# Patient Record
Sex: Female | Born: 2000 | Race: White | Hispanic: No | Marital: Single | Smoking: Never smoker
Health system: Southern US, Community
[De-identification: ages and names within clinical notes are randomized; demographics above are authoritative.]

---
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
## 2019-01-12 IMAGING — DX DG ABDOMEN 1V
2 series · 2 of 2 positions shown · non-contrast
Comparison: None.

CLINICAL DATA: Periumbilical pain for 5 days

EXAM:
ABDOMEN - 1 VIEW

[abdomen kub (1 of 2)]
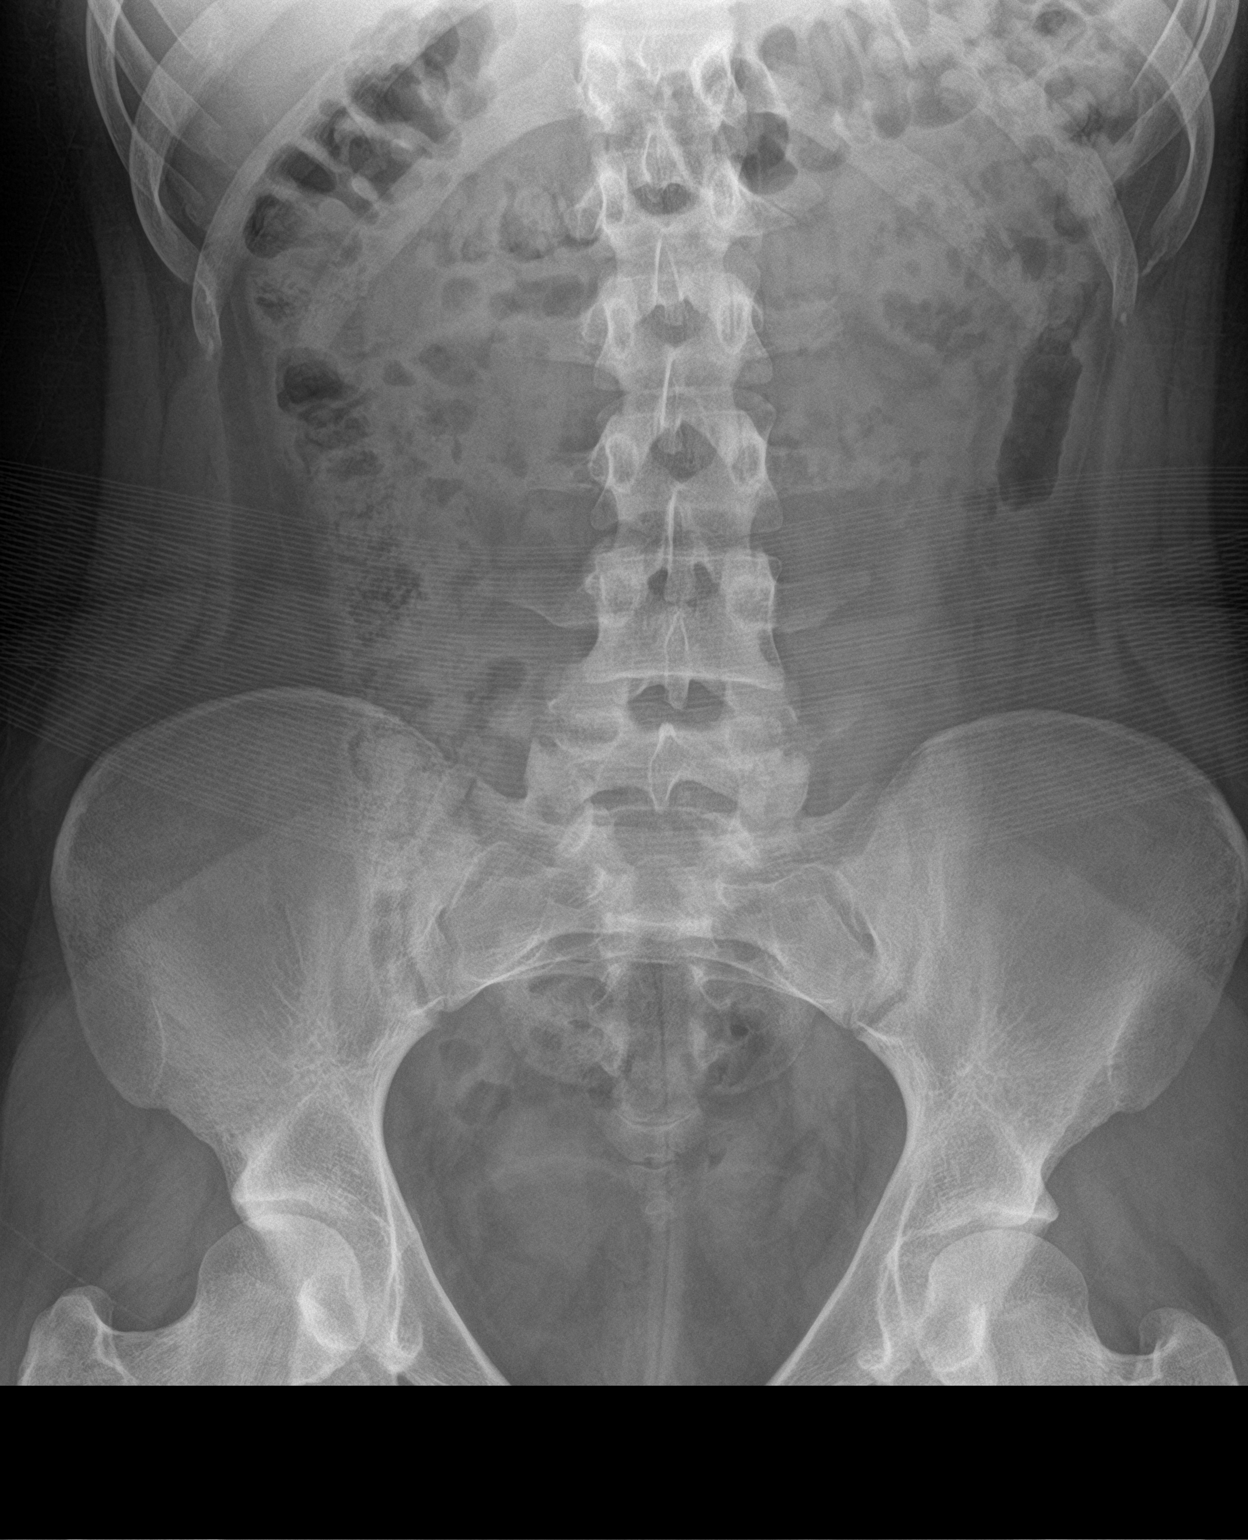

[abdomen kub (2 of 2)]
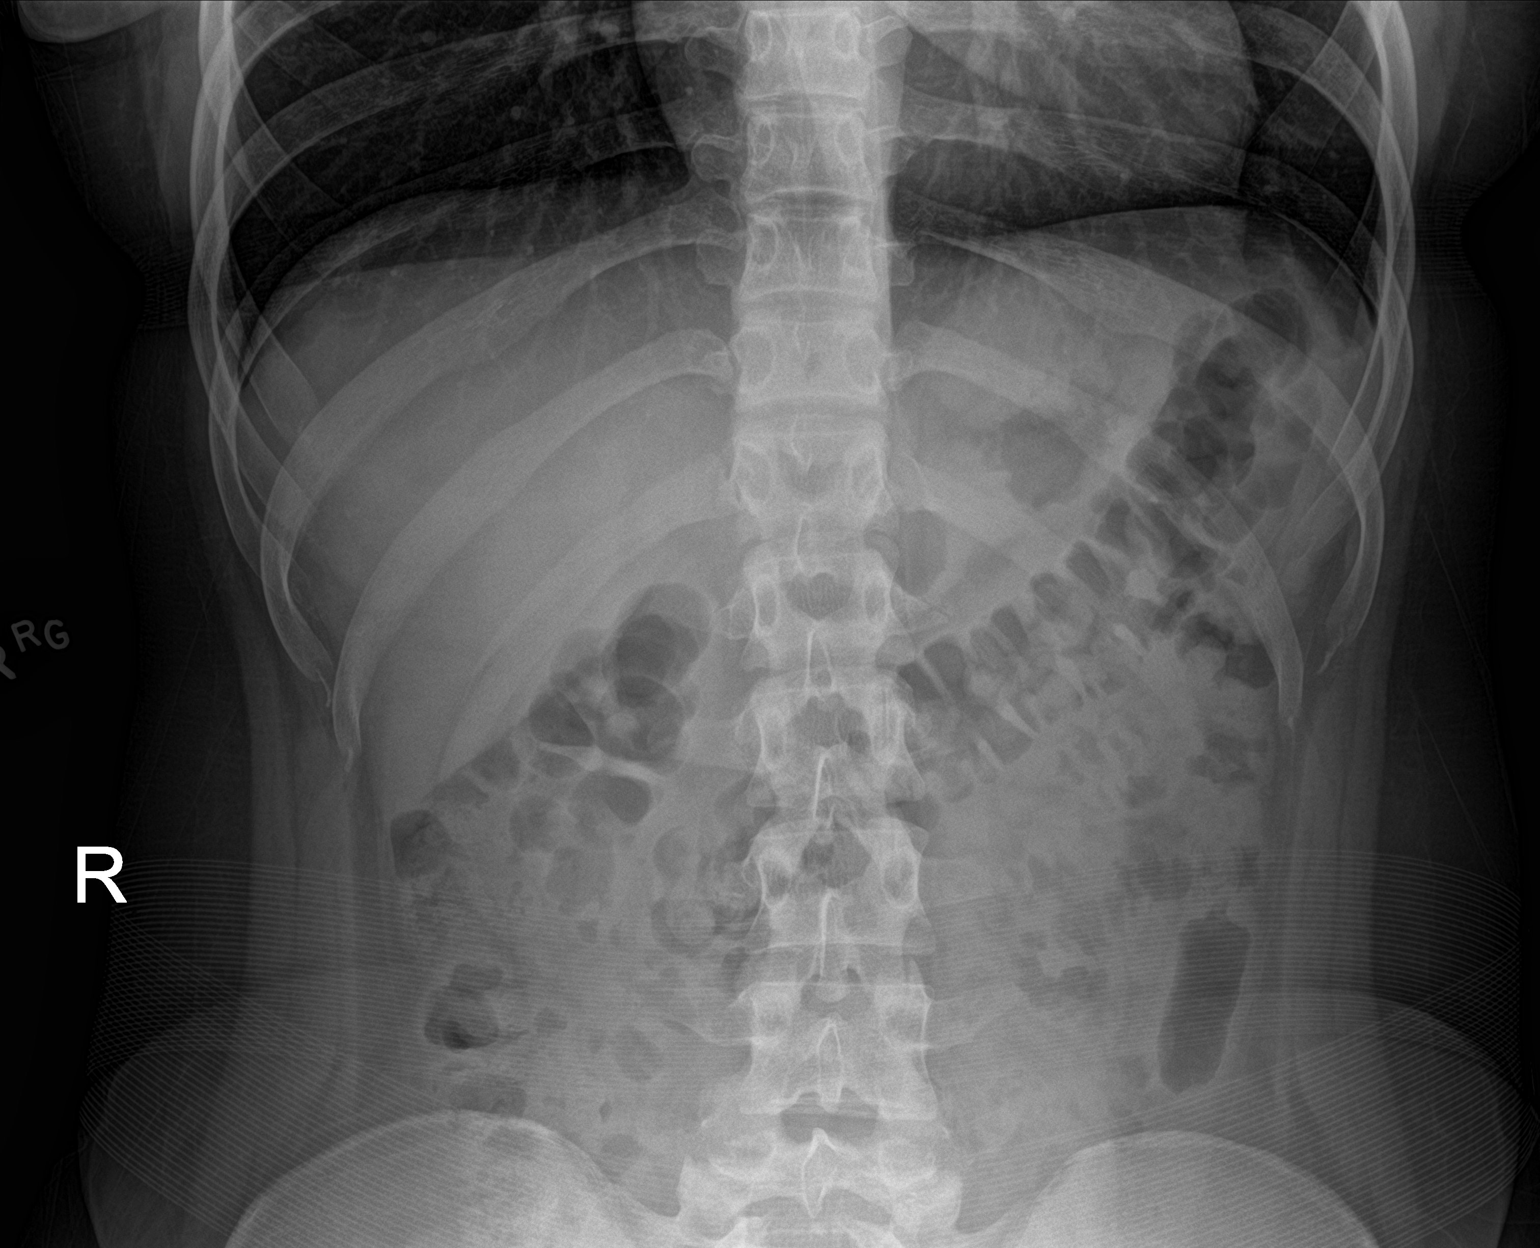

[2 of 2 positions shown; findings below may reference images not displayed]

FINDINGS: There is moderate stool throughout the colon. There is no bowel
dilatation or air-fluid level to suggest bowel obstruction. No free
air. No abnormal calcifications. Lung bases are clear.
IMPRESSION: No evident bowel obstruction or free air.  Moderate stool in colon.

## 2024-01-09 ENCOUNTER — Ambulatory Visit
Admission: RE | Admit: 2024-01-09 | Discharge: 2024-01-09 | Disposition: A | Discharge status: Home / Self Care | Payer: 59 | Source: Ambulatory Visit

## 2024-01-09 ENCOUNTER — Other Ambulatory Visit: Payer: Self-pay

## 2024-01-09 VITALS — BP 140/87 | HR 94 | Temp 98.6°F | Resp 17

## 2024-01-09 DIAGNOSIS — J0100 Acute maxillary sinusitis, unspecified: Secondary | ICD-10-CM | POA: Diagnosis not present

## 2024-01-09 DIAGNOSIS — R059 Cough, unspecified: Secondary | ICD-10-CM | POA: Diagnosis not present

## 2024-01-09 MED ORDER — AMOXICILLIN 875 MG PO TABS: 875.0000 mg | ORAL_TABLET | Freq: Two times a day (BID) | ORAL | 0 refills | Status: AC

## 2024-01-09 MED ORDER — PREDNISONE 20 MG PO TABS
ORAL_TABLET | ORAL | 0 refills | Status: AC
Start: 2024-01-09 — End: ?

## 2024-01-09 MED ORDER — HYDROCODONE BIT-HOMATROP MBR 5-1.5 MG/5ML PO SOLN: 5.0000 mL | Freq: Four times a day (QID) | ORAL | 0 refills | Status: AC | PRN

## 2024-01-09 NOTE — ED Provider Notes (Signed)
[facility]    CSN: [id number] Arrival date & time: 01/09/24  1006      History   Chief Complaint Chief Complaint  Patient presents with   Nasal Congestion   Ear Fullness   Cough    HPI Lisa English is a 23 y.o. female.   HPI 23 year old female presents with nasal congestion cough and ear fullness for 2 days patient reports that she is currently on amoxicillin and is concerned with pneumonia.  History reviewed. No pertinent past medical history.  There are no active problems to display for this patient.   History reviewed. No pertinent surgical history.  OB History   No obstetric history on file.      Home Medications    Prior to Admission medications   Medication Sig Start Date End Date Taking? Authorizing Provider  amoxicillin (AMOXIL) 875 MG tablet Take 1 tablet (875 mg total) by mouth 2 (two) times daily for 7 days. 01/09/24 01/16/24 Yes Trevor Iha, FNP  amphetamine-dextroamphetamine (ADDERALL XR) 20 MG 24 hr capsule Take by mouth. 03/18/23  Yes [provider]  HYDROcodone bit-homatropine (HYCODAN) 5-1.5 MG/5ML syrup Take 5 mLs by mouth every 6 (six) hours as needed for cough. 01/09/24  Yes Trevor Iha, FNP  predniSONE (DELTASONE) 20 MG tablet Take 3 tabs PO daily x 5 days. 01/09/24  Yes Trevor Iha, FNP  norethindrone-ethinyl estradiol-iron (ESTROSTEP FE,TILIA FE,TRI-LEGEST FE) 1-20/1-30/1-35 MG-MCG tablet Take 1 tablet by mouth daily.    [provider]    Family History History reviewed. No pertinent family history.  Social History Social History   Tobacco Use   Smoking status: Never   Smokeless tobacco: Never  Substance Use Topics   Alcohol use: No   Drug use: No     Allergies   Amoxicillin-pot clavulanate   Review of Systems Review of Systems  HENT:  Positive for congestion and ear pain.   Respiratory:  Positive for cough.   All other systems reviewed and are negative.    Physical Exam Triage Vital  Signs ED Triage Vitals  Encounter Vitals Group     BP      Systolic BP Percentile      Diastolic BP Percentile      Pulse      Resp      Temp      Temp src      SpO2      Weight      Height      Head Circumference      Peak Flow      Pain Score      Pain Loc      Pain Education      Exclude from Growth Chart    No data found.  Updated Vital Signs BP (!) 140/87 (BP Location: Right Arm)   Pulse 94   Temp 98.6 F (37 C) (Oral)   Resp 17   SpO2 100%    Physical Exam Vitals and nursing note reviewed.  Constitutional:      General: She is not in acute distress.    Appearance: Normal appearance. She is normal weight. She is ill-appearing.  HENT:     Head: Normocephalic and atraumatic.     Right Ear: Tympanic membrane and external ear normal.     Left Ear: Tympanic membrane and external ear normal.     Mouth/Throat:     Mouth: Mucous membranes are moist.     Pharynx: Oropharynx is clear.  Eyes:  Extraocular Movements: Extraocular movements intact.     Conjunctiva/sclera: Conjunctivae normal.     Pupils: Pupils are equal, round, and reactive to light.  Cardiovascular:     Rate and Rhythm: Normal rate and regular rhythm.     Pulses: Normal pulses.     Heart sounds: Normal heart sounds.  Pulmonary:     Effort: Pulmonary effort is normal.     Breath sounds: Normal breath sounds. No wheezing, rhonchi or rales.     Comments: Frequent nonproductive cough on exam Musculoskeletal:        General: Normal range of motion.     Cervical back: Normal range of motion and neck supple.  Skin:    General: Skin is warm and dry.  Neurological:     General: No focal deficit present.     Mental Status: She is alert and oriented to person, place, and time. Mental status is at baseline.  Psychiatric:        Mood and Affect: Mood normal.        Behavior: Behavior normal.      UC Treatments / Results  Labs (all labs ordered are listed, but only abnormal results are  displayed) Labs Reviewed - No data to display  EKG   Radiology No results found.  Procedures Procedures (including critical care time)  Medications Ordered in UC Medications - No data to display  Initial Impression / Assessment and Plan / UC Course  I have reviewed the triage vital signs and the nursing notes.  Pertinent labs & imaging results that were available during my care of the patient were reviewed by me and considered in my medical decision making (see chart for details).     MDM: 1.  Acute maxillary sinusitis, recurrence not specified-Rx'd amoxicillin 875 mg tablet: Take 1 tablet twice daily x 7 days patient reports taking this medication numerous times without adverse reaction.  Patient reports extreme allergy to clavulanic acid.;  2.  Cough, unspecified type-Rx prednisone 20 mg tablet: Take 3 tabs p.o. daily x 5 days, Rx'd Hycodan 5-1.5 mg / 5 mL syrup: Take 5 mL every 6 hours, as needed for cough. Advised patient to take medication as directed with food to completion.  Advised patient to take prednisone with first dose of Amoxicillin for the next 5 of 7 days.  Advised may use Hycodan cough syrup at night prior to sleep due to sedative effects.  Encouraged to increase daily water intake to 64 ounces per day while taking these medications.  Advised if symptoms worsen and/or unresolved please follow-up PCP or here for further evaluation.  Patient discharged home, hemodynamically stable. Final Clinical Impressions(s) / UC Diagnoses   Final diagnoses:  Cough, unspecified type  Acute maxillary sinusitis, recurrence not specified     Discharge Instructions      Advised patient to take medication as directed with food to completion.  Advised patient to take prednisone with first dose of Amoxicillin for the next 5 of 7 days.  Advised may use Hycodan cough syrup at night prior to sleep due to sedative effects.  Encouraged to increase daily water intake to 64 ounces per day while  taking these medications.  Advised if symptoms worsen and/or unresolved please follow-up PCP or here for further evaluation.     ED Prescriptions     Medication Sig Dispense Auth. Provider   amoxicillin (AMOXIL) 875 MG tablet Take 1 tablet (875 mg total) by mouth 2 (two) times daily for 7 days. 14 tablet  Trevor Iha, FNP   predniSONE (DELTASONE) 20 MG tablet Take 3 tabs PO daily x 5 days. 15 tablet Trevor Iha, FNP   HYDROcodone bit-homatropine (HYCODAN) 5-1.5 MG/5ML syrup Take 5 mLs by mouth every 6 (six) hours as needed for cough. 120 mL Trevor Iha, FNP      I have reviewed the PDMP during this encounter.   Trevor Iha, FNP 01/09/24 1115

## 2024-01-09 NOTE — ED Triage Notes (Signed)
Pt c/o nasal congestion, cough and ear congestion since Thurs. Currently on amoxicillin. Also taking tylenol and motrin prn. Hx of pneumonia.

## 2024-01-09 NOTE — Discharge Instructions (Addendum)
Advised patient to take medication as directed with food to completion.  Advised patient to take prednisone with first dose of Amoxicillin for the next 5 of 7 days.  Advised may use Hycodan cough syrup at night prior to sleep due to sedative effects.  Encouraged to increase daily water intake to 64 ounces per day while taking these medications.  Advised if symptoms worsen and/or unresolved please follow-up PCP or here for further evaluation.
